# Patient Record
Sex: Male | Born: 1975 | Race: White | Hispanic: No | Marital: Single | Smoking: Current every day smoker
Health system: Southern US, Community
[De-identification: ages and names within clinical notes are randomized; demographics above are authoritative.]

---

## 2008-07-12 ENCOUNTER — Emergency Department (HOSPITAL_COMMUNITY): Admission: EM | Admit: 2008-07-12 | Discharge: 2008-07-12 | Payer: Self-pay | Admitting: *Deleted

## 2010-02-27 IMAGING — CT CT HEAD W/O CM
1 of 2 series · 16 of 30 positions shown, 20 images · non-contrast
Comparison: None.

CLINICAL DATA: Occipital trauma, fall, headache

CT HEAD WITHOUT CONTRAST
TECHNIQUE: Contiguous axial images were obtained from the base of
the skull through the vertex without contrast

[Series 3: recon 2: brain · axial · 0.47mm/px · z∈[+140,+288]mm · 16 of 64 slices shown, 20 images]
[im 4/64  brain]
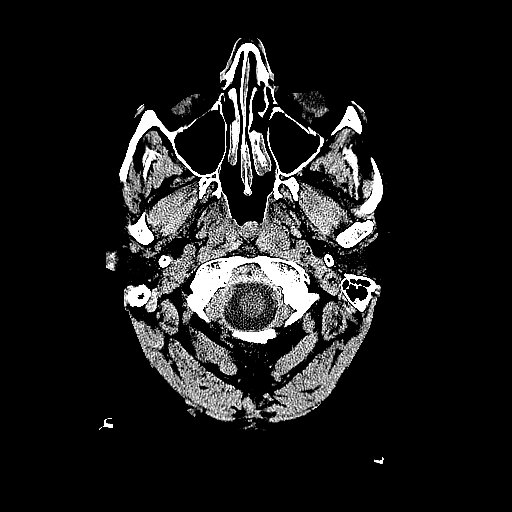
[im 4/64  bone]
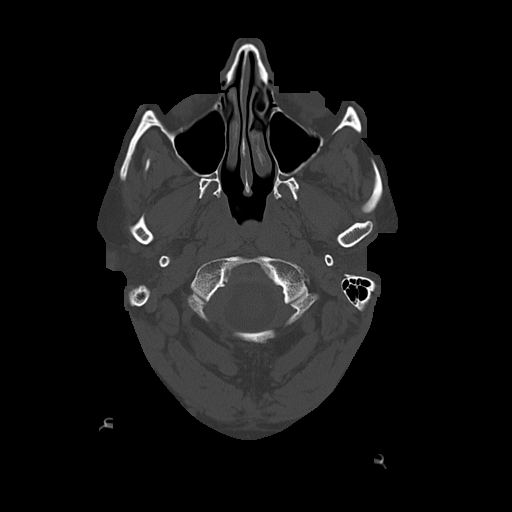
[im 7/64  brain]
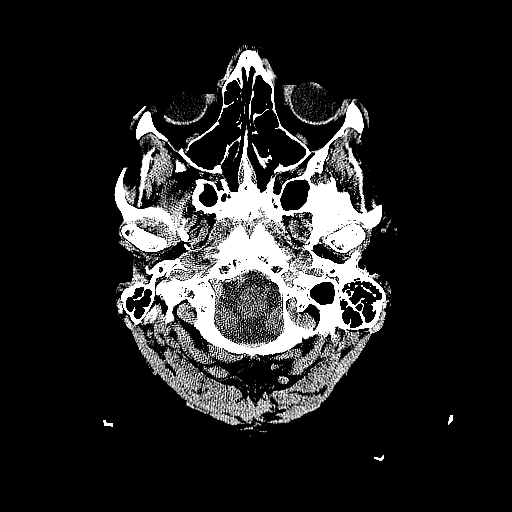
[im 10/64  brain]
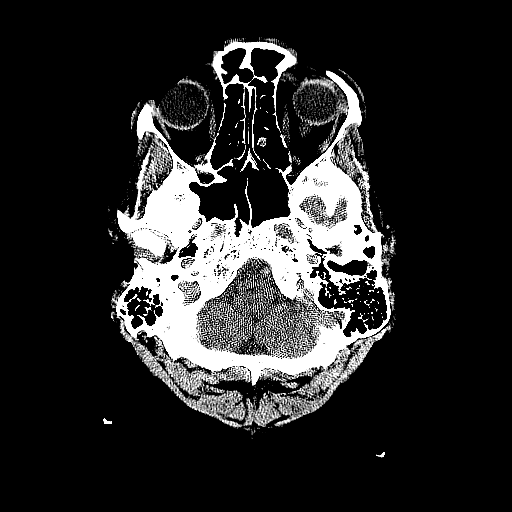
[im 14/64  brain]
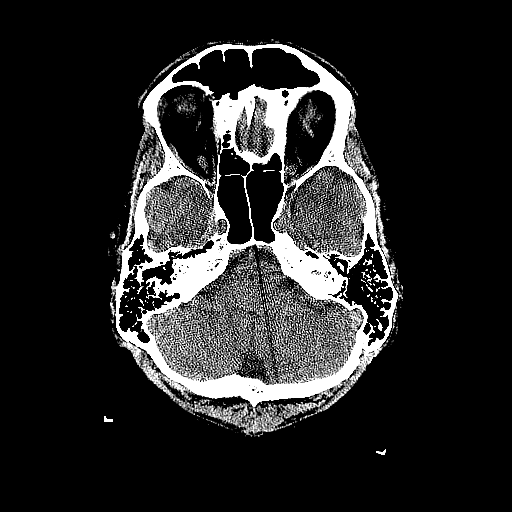
[im 20/64  brain]
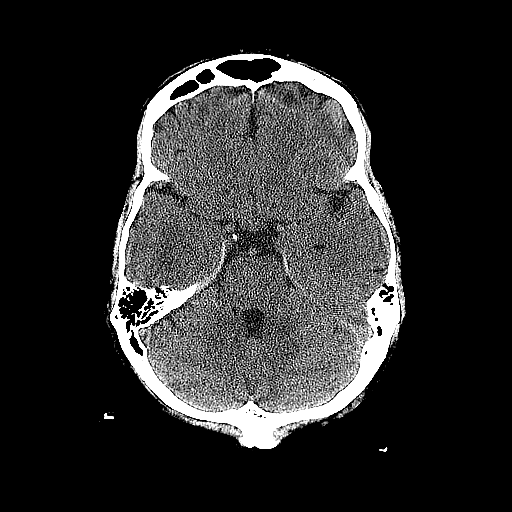
[im 20/64  bone]
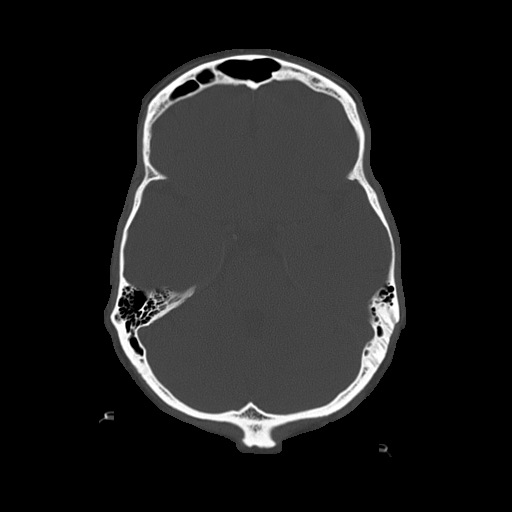
[im 24/64  brain]
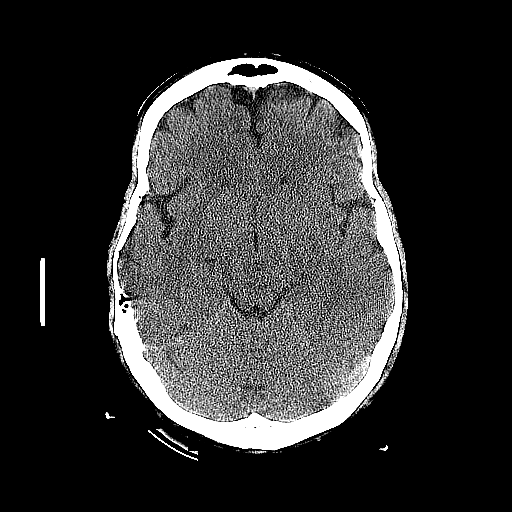
[im 27/64  brain]
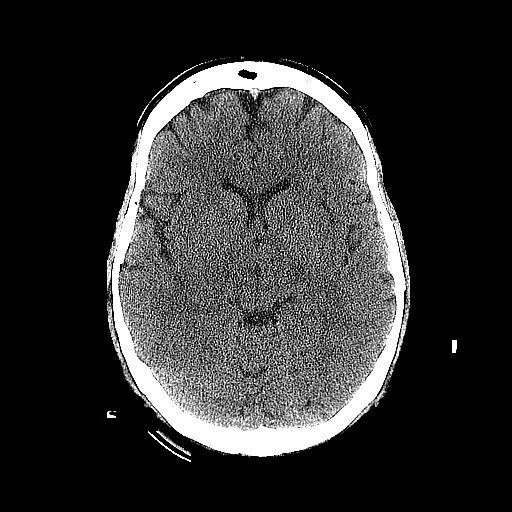
[im 30/64  brain]
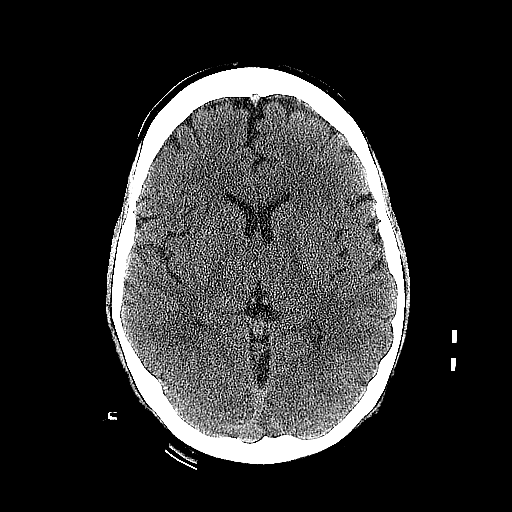
[im 34/64  brain]
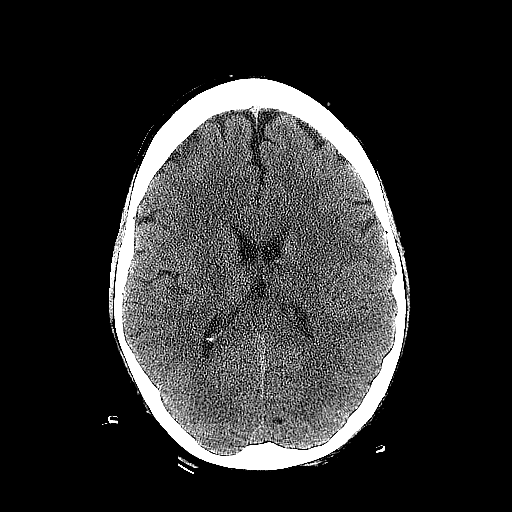
[im 34/64  bone]
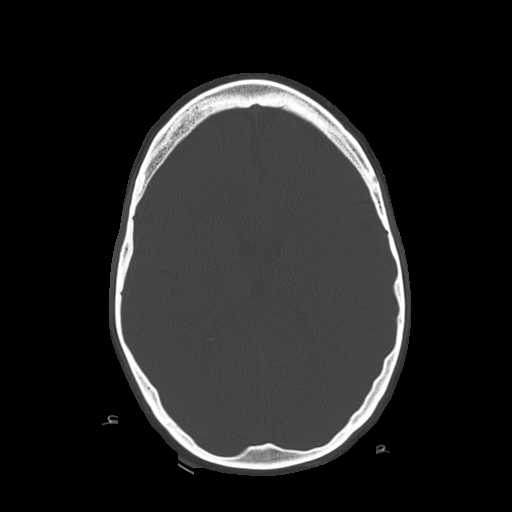
[im 37/64  brain]
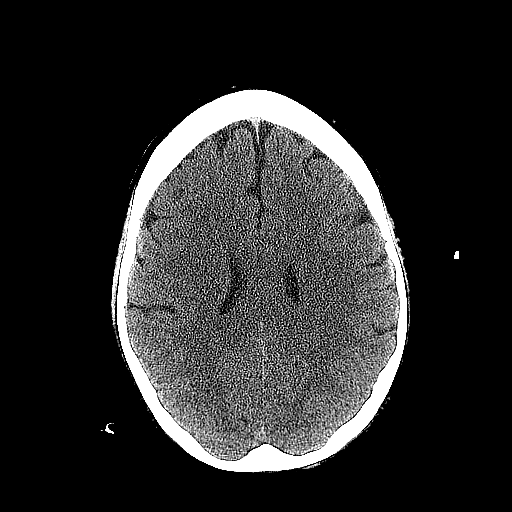
[im 40/64  brain]
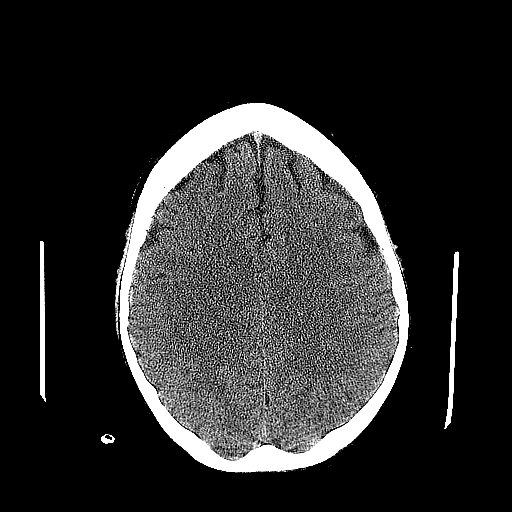
[im 44/64  brain]
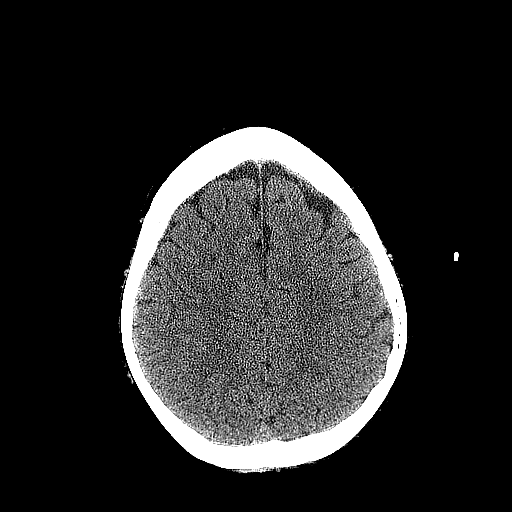
[im 50/64  brain]
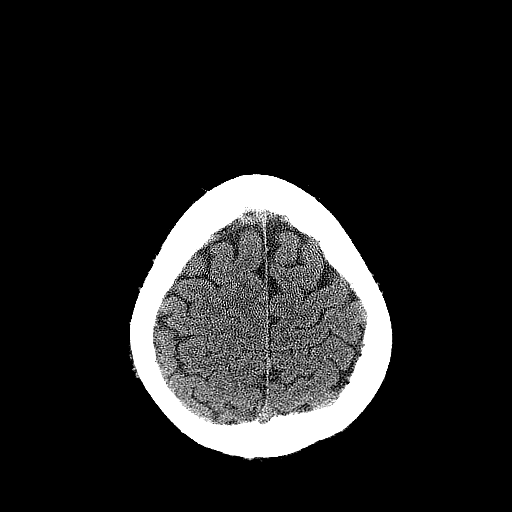
[im 50/64  bone]
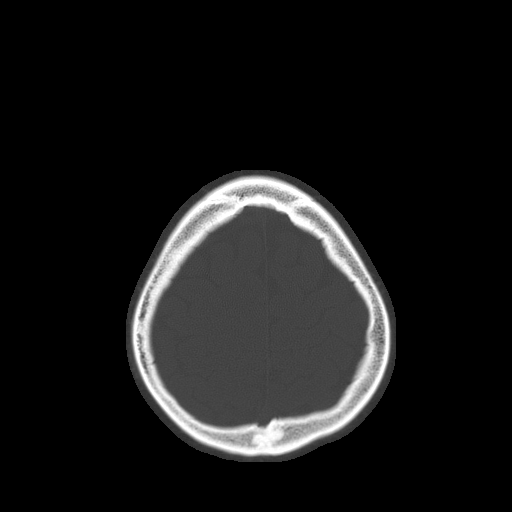
[im 54/64  brain]
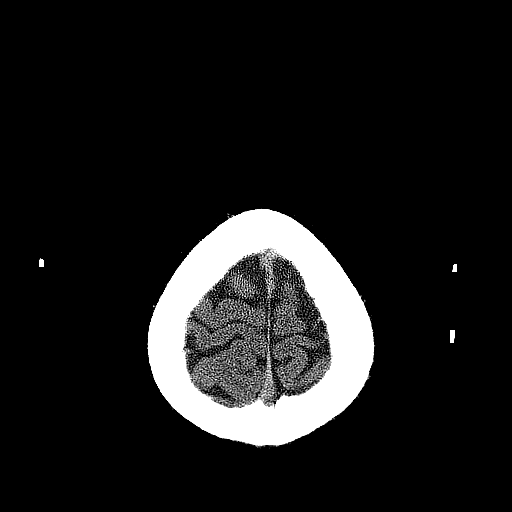
[im 57/64  brain]
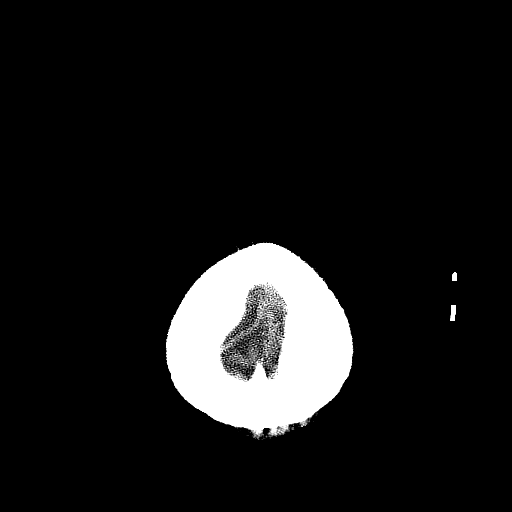
[im 60/64  brain]
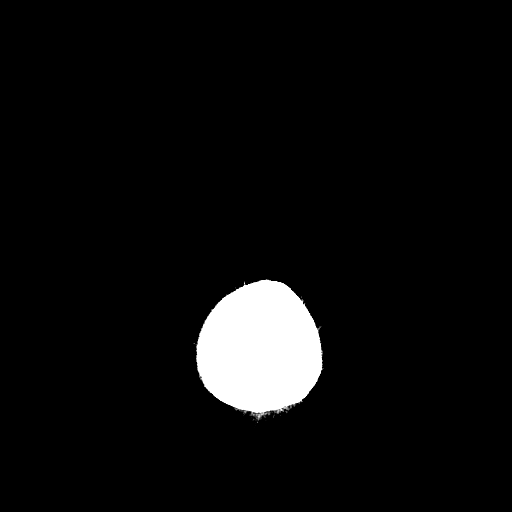

[16 of 30 positions shown; findings below may reference images not displayed]

FINDINGS: The brain has a normal appearance without evidence for
hemorrhage, acute infarction, hydrocephalus, or mass lesion.  There
is no extra axial fluid collection.  The skull and paranasal
sinuses are normal.
IMPRESSION: Normal CT of the head without contrast.

## 2016-05-11 ENCOUNTER — Ambulatory Visit (INDEPENDENT_AMBULATORY_CARE_PROVIDER_SITE_OTHER): Payer: Self-pay | Admitting: Physician Assistant

## 2016-05-11 VITALS — BP 96/63 | HR 94 | Temp 98.8°F | Resp 16 | Ht 72.0 in | Wt 199.2 lb

## 2016-05-11 DIAGNOSIS — I951 Orthostatic hypotension: Secondary | ICD-10-CM

## 2016-05-11 DIAGNOSIS — E860 Dehydration: Secondary | ICD-10-CM

## 2016-05-11 DIAGNOSIS — R110 Nausea: Secondary | ICD-10-CM

## 2016-05-11 LAB — POCT URINALYSIS DIP (MANUAL ENTRY)
Glucose, UA: NEGATIVE
LEUKOCYTES UA: NEGATIVE
NITRITE UA: NEGATIVE
PH UA: 6.5
RBC UA: NEGATIVE
Spec Grav, UA: 1.025
UROBILINOGEN UA: 0.2

## 2016-05-11 MED ORDER — ONDANSETRON 4 MG PO TBDP: 4.0000 mg | ORAL_TABLET | Freq: Once | ORAL | Status: AC

## 2016-05-11 MED ORDER — ONDANSETRON HCL 4 MG PO TABS: 4.0000 mg | ORAL_TABLET | Freq: Three times a day (TID) | ORAL | 0 refills | Status: AC | PRN

## 2016-05-11 NOTE — Progress Notes (Addendum)
05/11/2016 12:59 PM   DOB: 1975/10/29 / MRN: [id number]  SUBJECTIVE:  William Williams is a 40 y.o. male presenting for nausea and dizziness with standing that started at 7 pm last night.  Complains of myalgia throughout his entire body and HA as well. He has tried Zofran and reports this did not help his symptoms.  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 He does report he ate a "deviled egg" last night and his symptoms started after that.     He has No Known Allergies.   He  has no past medical history on file.    He  reports that he has been smoking Cigarettes.  He has a 22.50 pack-year smoking history. He has never used smokeless tobacco. He reports that he does not drink alcohol or use drugs. He  has no sexual activity history on file. The patient  has no past surgical history on file.  His family history is not on file.  Review of Systems  Constitutional: Positive for fever (tactile).  Respiratory: Negative for cough and shortness of breath.   Cardiovascular: Negative for chest pain.  Gastrointestinal: Negative for blood in stool, constipation, diarrhea, melena and vomiting.  Skin: Negative for rash.  Neurological: Positive for dizziness.    The problem list and medications were reviewed and updated by myself where necessary and exist elsewhere in the encounter.   OBJECTIVE:  BP 96/63 (BP Location: Left Arm, Patient Position: Sitting, Cuff Size: Small)   Pulse 94   Temp 98.8 F (37.1 C) (Oral)   Resp 16   Ht 6' (1.829 m)   Wt 199 lb 3.2 oz (90.4 kg)   SpO2 95%   BMI 27.02 kg/m   Physical Exam  Constitutional: He is oriented to person, place, and time.  Cardiovascular: Normal rate and regular rhythm.   Pulmonary/Chest: Effort normal and breath sounds normal. He has no rales.  Abdominal: Soft. Bowel sounds are normal. He exhibits no distension and no mass. There is no tenderness. There is no rebound and no guarding.    Musculoskeletal: Normal range of motion.  Neurological: He is alert and oriented to person, place, and time.  Skin: Skin is warm and dry.  Psychiatric: He has a normal mood and affect.    Orthostatic VS for the past 24 hrs:  BP- Lying Pulse- Lying BP- Sitting Pulse- Sitting BP- Standing at 0 minutes Pulse- Standing at 0 minutes  05/11/16 1235 115/60 73 127/76 73 116/77 70  05/11/16 1059 117/78 81 110/76 89 93/61 98     Results for orders placed or performed in visit on 05/11/16 (from the past 72 hour(s))  POCT urinalysis dipstick     Status: Abnormal   Collection Time: 05/11/16 11:53 AM  Result Value Ref Range   Color, UA yellow yellow   Clarity, UA clear clear   Glucose, UA negative negative   Bilirubin, UA small (A) negative   Ketones, POC UA trace (5) (A) negative   Spec Grav, UA 1.025    Blood, UA negative negative   pH, UA 6.5    Protein Ur, POC trace (A) negative   Urobilinogen, UA 0.2    Nitrite, UA Negative Negative   Leukocytes, UA Negative Negative     No results found.  ASSESSMENT AND PLAN  William Williams was seen today for headache.  Diagnoses and all orders for this visit:  Dehydration: See problem 2.   Orthostatic  hypotension: Per urine he is dehydrated. His symptoms have largely resolved aside from a vastly improved generalized HA, with 1.5 liters of fluid and 8 of zofran.  This is likely GI toxin mediated.  Advised that I won't be surprised if he begins have non blood diarrhea.  He will come back tomorrow or Saturday if he begins to feel poorly again.  -     POCT urinalysis dipstick  Nausea without vomiting -     ondansetron (ZOFRAN-ODT) disintegrating tablet 4 mg; Take 1 tablet (4 mg total) by mouth once. -     ondansetron (ZOFRAN-ODT) disintegrating tablet 4 mg; Take 1 tablet (4 mg total) by mouth once. -     ondansetron (ZOFRAN) 4 MG tablet; Take 1 tablet (4 mg total) by mouth every 8 (eight) hours as needed for nausea or vomiting.    The patient is  advised to call or return to clinic if he does not see an improvement in symptoms, or to seek the care of the closest emergency department if he worsens with the above plan.   Deliah BostonMichael Nicholai Williams, MHS, PA-C [facility] 05/11/2016 12:59 PM

## 2016-05-11 NOTE — Patient Instructions (Signed)
     IF you received an x-ray today, you will receive an invoice from [facility]. Please contact [facility] at [phone] with questions or concerns regarding your invoice.   IF you received labwork today, you will receive an invoice from [facility]. Please contact [facility] at [phone] with questions or concerns regarding your invoice.   Our billing staff will not be able to assist you with questions regarding bills from these companies.  You will be contacted with the lab results as soon as they are available. The fastest way to get your results is to activate your My Chart account. Instructions are located on the last page of this paperwork. If you have not heard from us regarding the results in 2 weeks, please contact this office.     

## 2018-05-18 ENCOUNTER — Other Ambulatory Visit: Payer: Self-pay

## 2018-05-18 ENCOUNTER — Emergency Department (HOSPITAL_COMMUNITY)
Admission: EM | Admit: 2018-05-18 | Discharge: 2018-05-18 | Discharge status: Against Medical Advice | Payer: BLUE CROSS/BLUE SHIELD | Attending: Emergency Medicine | Admitting: Emergency Medicine

## 2018-05-18 DIAGNOSIS — R112 Nausea with vomiting, unspecified: Secondary | ICD-10-CM | POA: Insufficient documentation

## 2018-05-18 DIAGNOSIS — Z5321 Procedure and treatment not carried out due to patient leaving prior to being seen by health care provider: Secondary | ICD-10-CM | POA: Insufficient documentation

## 2018-05-18 DIAGNOSIS — R197 Diarrhea, unspecified: Secondary | ICD-10-CM | POA: Insufficient documentation

## 2018-05-18 LAB — COMPREHENSIVE METABOLIC PANEL
ALK PHOS: 50 U/L (ref 38–126)
ALT: 22 U/L (ref 0–44)
ANION GAP: 11 (ref 5–15)
AST: 26 U/L (ref 15–41)
Albumin: 4.4 g/dL (ref 3.5–5.0)
BUN: 17 mg/dL (ref 6–20)
CALCIUM: 9.5 mg/dL (ref 8.9–10.3)
CHLORIDE: 106 mmol/L (ref 98–111)
CO2: 25 mmol/L (ref 22–32)
CREATININE: 1.04 mg/dL (ref 0.61–1.24)
GFR calc Af Amer: >60 mL/min (ref >60)
GFR calc non Af Amer: >60 mL/min (ref >60)
GLUCOSE: 127 mg/dL — AB (ref 70–99)
Potassium: 4.2 mmol/L (ref 3.5–5.1)
SODIUM: 142 mmol/L (ref 135–145)
Total Bilirubin: 0.9 mg/dL (ref 0.3–1.2)
Total Protein: 7.5 g/dL (ref 6.5–8.1)

## 2018-05-18 LAB — CBC
HEMATOCRIT: 49.8 % (ref 39.0–52.0)
Hemoglobin: 15.8 g/dL (ref 13.0–17.0)
MCH: 28.6 pg (ref 26.0–34.0)
MCHC: 31.7 g/dL (ref 30.0–36.0)
MCV: 90.2 fL (ref 80.0–100.0)
PLATELETS: 255 10*3/uL (ref 150–400)
RBC: 5.52 MIL/uL (ref 4.22–5.81)
RDW: 13.4 % (ref 11.5–15.5)
WBC: 15.2 10*3/uL — ABNORMAL HIGH (ref 4.0–10.5)
nRBC: 0 % (ref 0.0–0.2)

## 2018-05-18 LAB — LIPASE, BLOOD: LIPASE: 26 U/L (ref 11–51)

## 2018-05-18 NOTE — ED Notes (Addendum)
Called patient to reassess vitals and patient states that he is leaving; states "I only came for a work note and anti-emetic". Patient has had no episodes of vomiting in the lobby.

## 2018-05-18 NOTE — ED Triage Notes (Signed)
Patient c/o N/V/D when he awoke tonight at 6p.m.

## 2018-11-26 ENCOUNTER — Other Ambulatory Visit: Payer: Self-pay | Admitting: *Deleted

## 2018-11-26 DIAGNOSIS — Z20822 Contact with and (suspected) exposure to covid-19: Secondary | ICD-10-CM

## 2018-11-26 NOTE — Progress Notes (Signed)
lab

## 2018-11-30 NOTE — Addendum Note (Signed)
Addended by: Thayer Inabinet M on: 11/30/2018 10:06 AM   Modules accepted: Orders
